# Patient Record
Sex: Male | Born: 1980 | Hispanic: No | Marital: Married
Health system: Southern US, Community
[De-identification: ages and names within clinical notes are randomized; demographics above are authoritative.]

---

## 2019-12-10 ENCOUNTER — Emergency Department: Payer: Self-pay

## 2019-12-10 ENCOUNTER — Emergency Department
Admission: EM | Admit: 2019-12-10 | Discharge: 2019-12-10 | Disposition: A | Discharge status: Home / Self Care | Payer: Self-pay | Attending: Emergency Medicine | Admitting: Emergency Medicine

## 2019-12-10 ENCOUNTER — Other Ambulatory Visit: Payer: Self-pay

## 2019-12-10 DIAGNOSIS — M25561 Pain in right knee: Secondary | ICD-10-CM | POA: Diagnosis not present

## 2019-12-10 DIAGNOSIS — Y9289 Other specified places as the place of occurrence of the external cause: Secondary | ICD-10-CM | POA: Diagnosis not present

## 2019-12-10 DIAGNOSIS — M25562 Pain in left knee: Secondary | ICD-10-CM | POA: Diagnosis not present

## 2019-12-10 DIAGNOSIS — Y990 Civilian activity done for income or pay: Secondary | ICD-10-CM

## 2019-12-10 DIAGNOSIS — S39012A Strain of muscle, fascia and tendon of lower back, initial encounter: Secondary | ICD-10-CM

## 2019-12-10 DIAGNOSIS — Y939 Activity, unspecified: Secondary | ICD-10-CM | POA: Diagnosis not present

## 2019-12-10 DIAGNOSIS — Y999 Unspecified external cause status: Secondary | ICD-10-CM | POA: Diagnosis not present

## 2019-12-10 DIAGNOSIS — X58XXXA Exposure to other specified factors, initial encounter: Secondary | ICD-10-CM | POA: Diagnosis not present

## 2019-12-10 DIAGNOSIS — M25569 Pain in unspecified knee: Secondary | ICD-10-CM | POA: Diagnosis present

## 2019-12-10 NOTE — ED Provider Notes (Signed)
[facility] Emergency Department Provider Note ____________________________________________   First MD Initiated Contact with Patient 12/10/19 1245     (approximate)  I have reviewed the triage vital signs and the nursing notes.  HISTORY  Chief Complaint Back Pain and Knee Pain   HPI Lance Meadows is a 39 y.o. malewho presents to the ED for evaluation of back and knee pain.  Chart review indicates no relevant medical history. Patient denies any relevant medical history.  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  He reports having to emergently lift a patient from the floor onto the stretcher by himself in a peri-code situation.  Patient reports noticing popping sensation to his bilateral knees in his lower thoracic back, and developing severe pain since then.  Reporting 7/10 intensity pain to his bilateral knees and his lower thoracic/upper lumbar back has been constant since the incident, that occurred about 2 hours ago.  Patient been ambulatory since the event, though reports antalgic gait and severe pain.  Denies incontinence, urinary/stool retention.  Denies syncope, headache.  Denies any trauma or falls beyond the lifting mechanism.    No past medical history on file.  There are no problems to display for this patient.   Prior to Admission medications   Not on File    Allergies Patient has no known allergies.  No family history on file.  Social History Social History   Tobacco Use  . Smoking status: Not on file  Substance Use Topics  . Alcohol use: Not on file  . Drug use: Not on file    Review of Systems  Constitutional: No fever/chills Eyes: No visual changes. ENT: No sore throat. Cardiovascular: Denies chest pain. Respiratory: Denies shortness of breath. Gastrointestinal: No abdominal pain.  No nausea, no vomiting.  No diarrhea.  No constipation. Genitourinary: Negative for dysuria. Musculoskeletal: Positive for back and  bilateral knee pain Skin: Negative for rash. Neurological: Negative for headaches, focal weakness or numbness.   ____________________________________________   PHYSICAL EXAM:  VITAL SIGNS: Vitals:   12/10/19 1258  BP: 134/67  Pulse: 68  Resp: 16  Temp: 98.5 F (36.9 C)  SpO2: 99%      Constitutional: Alert and oriented. Well appearing and in no acute distress. Eyes: Conjunctivae are normal. PERRL. EOMI. Head: Atraumatic. Nose: No congestion/rhinnorhea. Mouth/Throat: Mucous membranes are moist.  Oropharynx non-erythematous. Neck: No stridor. No cervical spine tenderness to palpation. Cardiovascular: Normal rate, regular rhythm. Grossly normal heart sounds.  Good peripheral circulation. Respiratory: Normal respiratory effort.  No retractions. Lungs CTAB. Gastrointestinal: Soft , nondistended, nontender to palpation. No abdominal bruits. No CVA tenderness. Musculoskeletal:   No joint effusions. No signs of acute trauma. Diffuse tenderness over the left knee without bony step-offs, laceration or evidence of open injury.  Worsening pain with Thessaly maneuver.  No laxity with Lachman.  Diffuse tenderness of the right knee without bony step-off, laceration or evidence of open injury.  Worsening pain with Thessaly maneuver.  No laxity with Lachman maneuver. No spinal step-offs, but tenderness to palpation directly overlying the lower thoracic spine.  No overlying signs of trauma.  Neurologic:  Normal speech and language. No gross focal neurologic deficits are appreciated. No gait instability noted. Cranial nerves II through XII intact 5/5 strength and sensation in all 4 extremities\ Skin:  Skin is warm, dry and intact. No rash noted. Psychiatric: Mood and affect are normal. Speech and behavior are normal. ____________________________________________  RADIOLOGY  ED MD interpretation:    Official  radiology report(s): MR THORACIC SPINE WO CONTRAST  Result Date:  12/10/2019 CLINICAL DATA:  Mid back injury after lifting a patient off the floor earlier today. EXAM: MRI THORACIC SPINE WITHOUT CONTRAST TECHNIQUE: Multiplanar, multisequence MR imaging of the thoracic spine was performed. No intravenous contrast was administered. COMPARISON:  None. FINDINGS: Alignment:  Physiologic. Vertebrae: No fracture, evidence of discitis, or bone lesion. Cord:  Normal signal and morphology. Paraspinal and other soft tissues: Negative. Disc levels: Tiny central disc protrusions at T6-T7 and T7-T8. No spinal canal or neuroforaminal stenosis at any level. IMPRESSION: 1. No acute abnormality. 2. Tiny central disc protrusions at T6-T7 and T7-T8. No stenosis at any level. Electronically Signed   By: Obie Dredge M.D.   On: 12/10/2019 14:56   MR KNEE RIGHT WO CONTRAST  Result Date: 12/10/2019 CLINICAL DATA:  Knee injury lifting a patient off the floor earlier today. EXAM: MRI OF THE RIGHT KNEE WITHOUT CONTRAST TECHNIQUE: Multiplanar, multisequence MR imaging of the knee was performed. No intravenous contrast was administered. COMPARISON:  None. FINDINGS: MENISCI Medial meniscus:  Intact. Lateral meniscus:  Intact. LIGAMENTS Cruciates:  Intact ACL and PCL. Collaterals: Medial collateral ligament is intact. Lateral collateral ligament complex is intact. CARTILAGE Patellofemoral:  No chondral defect. Medial: Cartilage thinning and superficial irregularity over the central weight-bearing medial femoral condyle. Lateral:  No chondral defect. Joint:  No joint effusion. Normal Hoffa's fat. No plical thickening. Popliteal Fossa: No significant baker cyst. Intact popliteus tendon. Extensor Mechanism: Intact quadriceps tendon and patellar tendon. Intact medial and lateral patellar retinaculum. Intact MPFL. Bones: No focal marrow signal abnormality. No fracture or dislocation. Other: None. IMPRESSION: 1. No evidence of internal derangement. 2. Early medial compartment degenerative changes.  Electronically Signed   By: Obie Dredge M.D.   On: 12/10/2019 14:37   MR KNEE LEFT WO CONTRAST  Result Date: 12/10/2019 CLINICAL DATA:  Knee injury lifting a patient off the floor earlier today. EXAM: MRI OF THE LEFT KNEE WITHOUT CONTRAST TECHNIQUE: Multiplanar, multisequence MR imaging of the knee was performed. No intravenous contrast was administered. COMPARISON:  None. FINDINGS: MENISCI Medial meniscus:  Intact. Lateral meniscus:  Intact. LIGAMENTS Cruciates:  Intact ACL and PCL. Collaterals: Medial collateral ligament is intact. Lateral collateral ligament complex is intact. CARTILAGE Patellofemoral:  No chondral defect. Medial:  No chondral defect. Lateral:  No chondral defect. Joint:  No joint effusion. Normal Hoffa's fat. No plical thickening. Popliteal Fossa: No significant baker cyst. Intact popliteus tendon. Extensor Mechanism: Intact quadriceps tendon and patellar tendon. Intact medial and lateral patellar retinaculum. Intact MPFL. Bones: No focal marrow signal abnormality. No fracture or dislocation. Other: None. IMPRESSION: 1. No evidence of internal derangement. Electronically Signed   By: Obie Dredge M.D.   On: 12/10/2019 14:33   ____________________________________________   MDM / ED COURSE  39 year old male presenting with work-related injury to his bilateral knees and lower thoracic back, without evidence of significant pathology and amenable to outpatient management.  Normal vital signs on room air.  Exam with signs of meniscus tear to his bilateral knees, with worsening pain with Thessaly maneuver.  No laxity with anterior drawer/Lachman maneuvers to suggest ACL pathology.  Exam further with lower thoracic direct tenderness to palpation that reproduces his severe symptoms.  Due to the severity of his symptoms and inability to safely have a prolonged course with patient due to his need for continued provision of ICU level of care, obtain MRIs of these areas of injury.  The  studies do not show  evidence of soft tissue damage or cord compression.  Most significant for T6-T8 disc protrusions, is a likely source of his lower back pain.  Knee is most significant for mild degenerative changes, but no ligamentous or meniscal injury.  Patient's pain is now well controlled.  We will provide appropriate orthopedic follow-up information.  Return precautions for the ED were discussed.  Patient medically stable for discharge home.  ____________________________________________   FINAL CLINICAL IMPRESSION(S) / ED DIAGNOSES  Final diagnoses:  Acute pain of both knees  Strain of lumbar region, initial encounter  Work related injury     ED Discharge Orders    None       Olivene Cookston Katrinka Blazing   Note:  This document was prepared using Conservation officer, historic buildings and may include unintentional dictation errors.   Delton Prairie, MD 12/10/19 [phone]

## 2019-12-10 NOTE — ED Triage Notes (Signed)
Pt had to emergently assist in lifting a covid+ patient off the floor to intubate them, pt injured his back and knee in the process

## 2019-12-10 NOTE — ED Notes (Signed)
Pt assisted a pt from the floor and injured both knees and mid back with bending and lifting the patient

## 2022-01-12 IMAGING — MR MR KNEE*R* W/O CM
7 series · 40 of 40 positions shown · non-contrast
Comparison: None.

CLINICAL DATA: Knee injury lifting a patient off the floor earlier
today.

EXAM:
MRI OF THE RIGHT KNEE WITHOUT CONTRAST
TECHNIQUE: Multiplanar, multisequence MR imaging of the knee was performed. No
intravenous contrast was administered.

[Series 8: T2 fat-sat · axial · right · 4.0mm · 0.50mm/px · z∈[-69,+75]mm · 5 of 30 slices shown (1 of 3)]
[im 1/30]
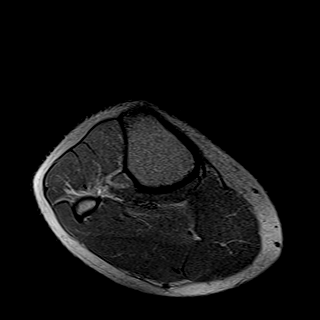
[im 8/30]
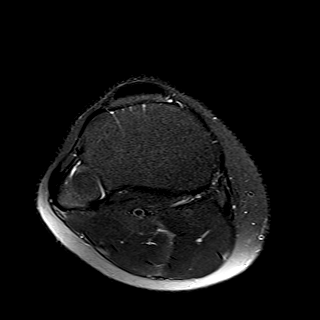
[im 15/30]
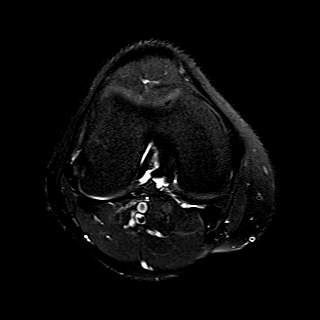
[im 22/30]
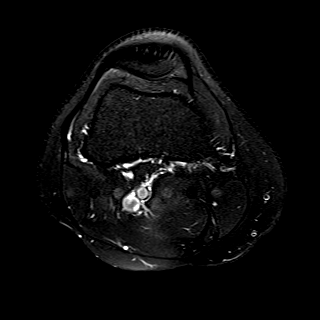
[im 30/30]
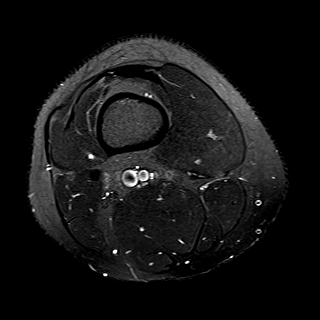

[Series 9: T2 fat-sat · coronal · right · 4.0mm · 0.59mm/px · 6 of 30 slices shown (2 of 3)]
[im 1/30]
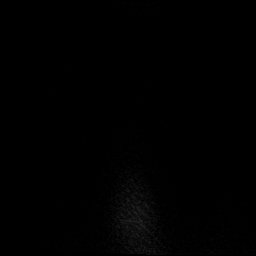
[im 6/30]
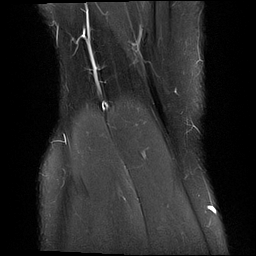
[im 12/30]
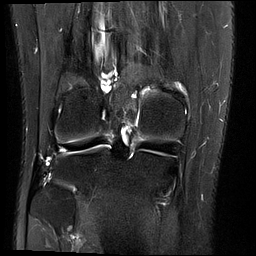
[im 18/30]
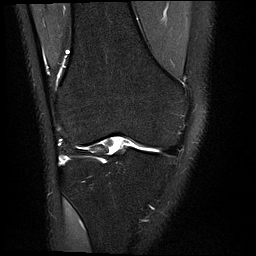
[im 24/30]
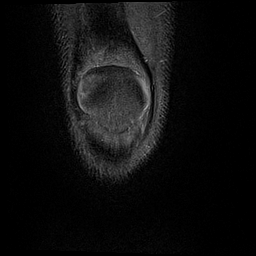
[im 30/30]
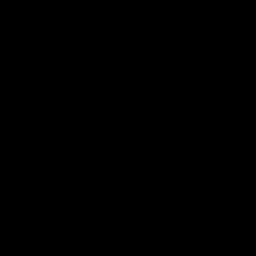

[Series 10: T1 · coronal · right · 4.0mm · 0.59mm/px · 6 of 30 slices shown]
[im 1/30]
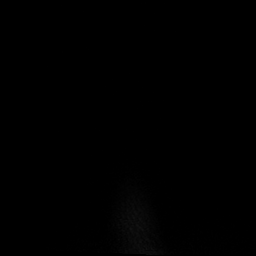
[im 6/30]
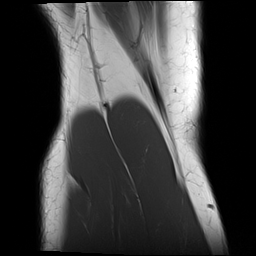
[im 12/30]
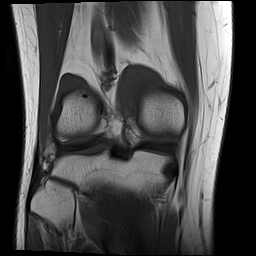
[im 18/30]
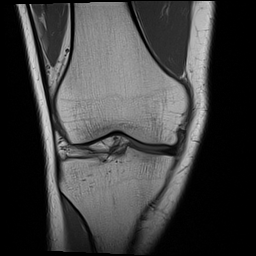
[im 24/30]
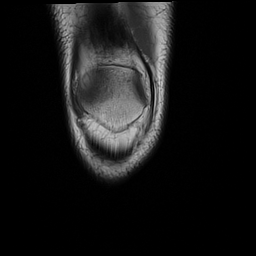
[im 30/30]
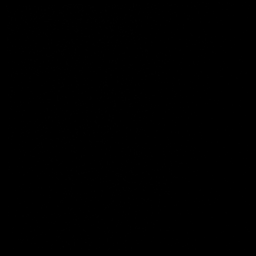

[Series 11: PD fat-sat · coronal · right · 4.0mm · 0.59mm/px · 6 of 30 slices shown (1 of 2)]
[im 1/30]
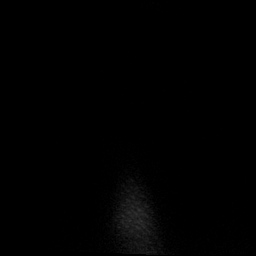
[im 6/30]
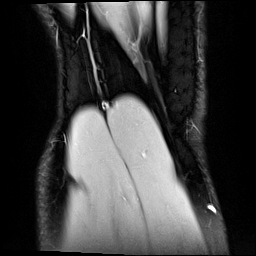
[im 12/30]
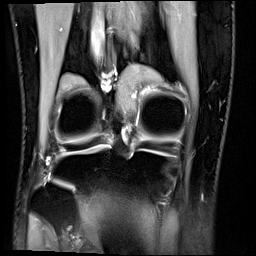
[im 18/30]
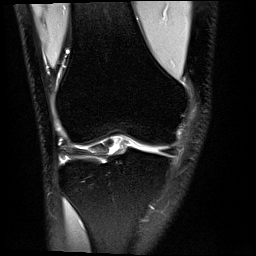
[im 24/30]
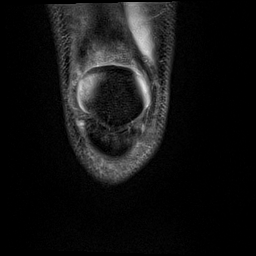
[im 30/30]
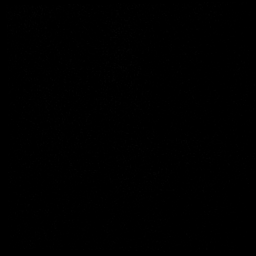

[Series 12: PD fat-sat · sagittal · right · 3.0mm · 0.59mm/px · 7 of 34 slices shown (2 of 2)]
[im 1/34]
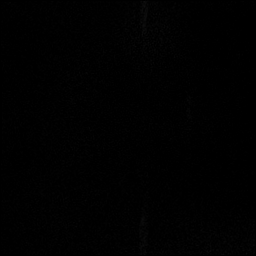
[im 6/34]
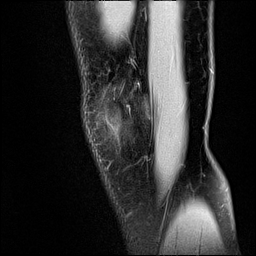
[im 12/34]
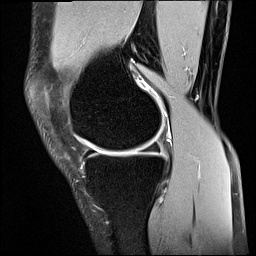
[im 17/34]
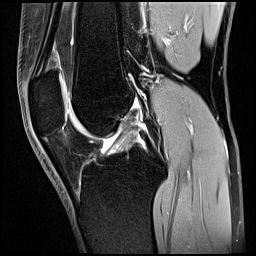
[im 23/34]
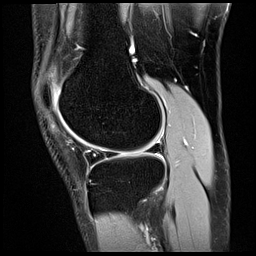
[im 28/34]
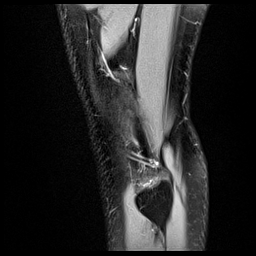
[im 34/34]
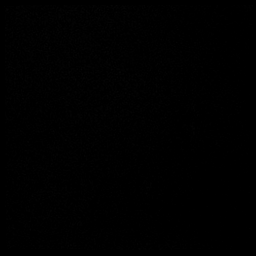

[Series 13: T2 fat-sat · sagittal · right · 3.0mm · 0.59mm/px · 7 of 36 slices shown (3 of 3)]
[im 1/36]
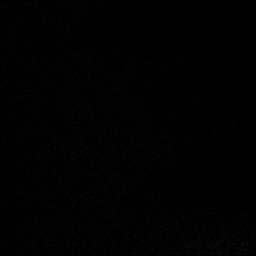
[im 6/36]
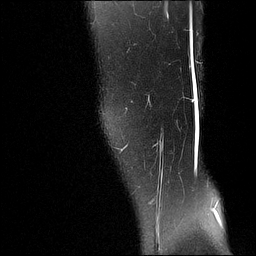
[im 12/36]
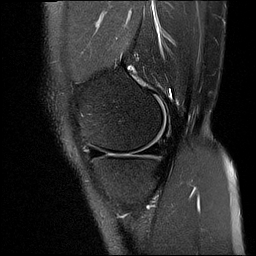
[im 18/36]
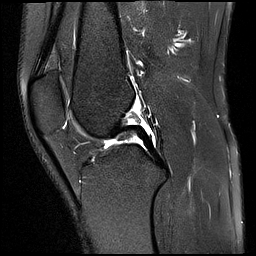
[im 24/36]
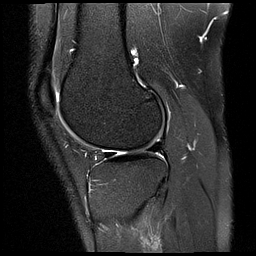
[im 30/36]
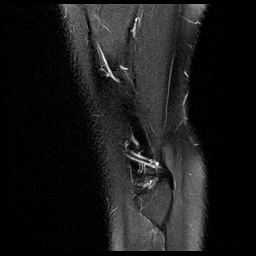
[im 36/36]
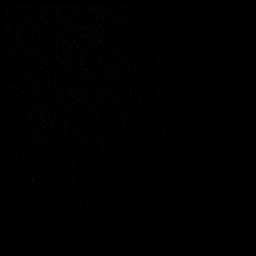

[Series 14: PD · oblique · right · 2.0mm · 0.47mm/px · 3 of 16 slices shown]
[im 1/16]
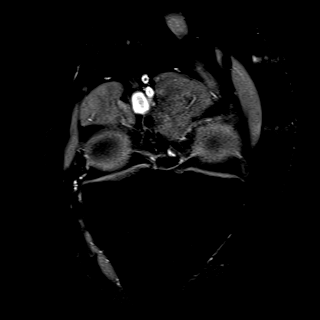
[im 8/16]
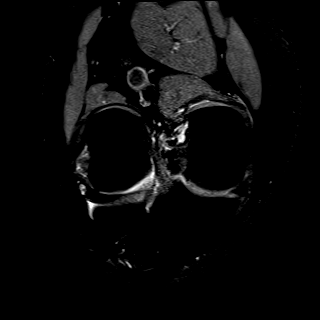
[im 16/16]
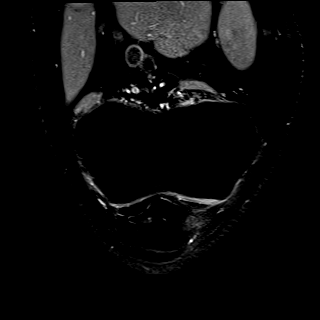

[40 of 40 positions shown; findings below may reference images not displayed]

FINDINGS: MENISCI

Medial meniscus:  Intact.

Lateral meniscus:  Intact.

LIGAMENTS

Cruciates:  Intact ACL and PCL.

Collaterals: Medial collateral ligament is intact. Lateral
collateral ligament complex is intact.

CARTILAGE

Patellofemoral:  No chondral defect.

Medial: Cartilage thinning and superficial irregularity over the
central weight-bearing medial femoral condyle.

Lateral:  No chondral defect.

Joint:  No joint effusion. Normal Hoffa's fat. No plical thickening.

Popliteal Fossa: No significant baker cyst. Intact popliteus tendon.

Extensor Mechanism: Intact quadriceps tendon and patellar tendon.
Intact medial and lateral patellar retinaculum. Intact MPFL.

Bones: No focal marrow signal abnormality. No fracture or
dislocation.

Other: None.
IMPRESSION: 1. No evidence of internal derangement.
2. Early medial compartment degenerative changes.

## 2022-01-12 IMAGING — MR MR THORACIC SPINE W/O CM
6 series · 29 of 48 positions shown · non-contrast
Comparison: None.

CLINICAL DATA: Mid back injury after lifting a patient off the
floor earlier today.

EXAM:
MRI THORACIC SPINE WITHOUT CONTRAST
TECHNIQUE: Multiplanar, multisequence MR imaging of the thoracic spine was
performed. No intravenous contrast was administered.

[Series 18: T1 · sagittal · 6.0mm · 1.41mm/px · 2 of 9 slices shown (1 of 2)]
[im 1/9]
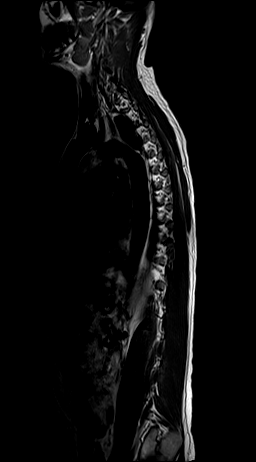
[im 9/9]
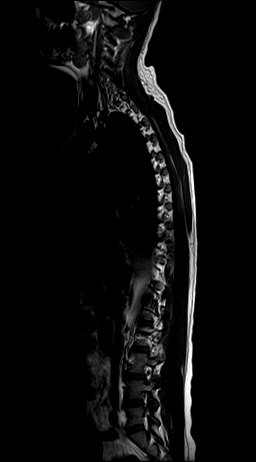

[Series 19: T2 · sagittal · 3.0mm · 1.06mm/px · 6 of 17 slices shown (1 of 2)]
[im 1/17]
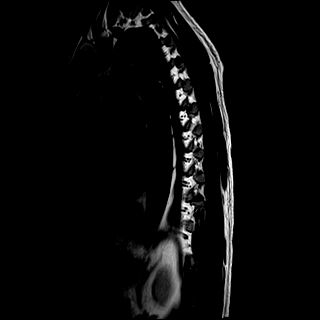
[im 4/17]
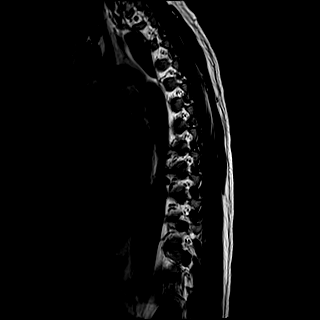
[im 7/17]
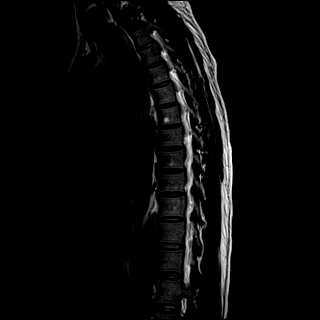
[im 10/17]
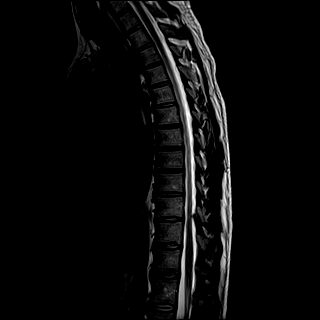
[im 13/17]
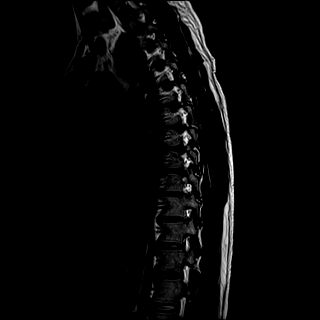
[im 17/17]
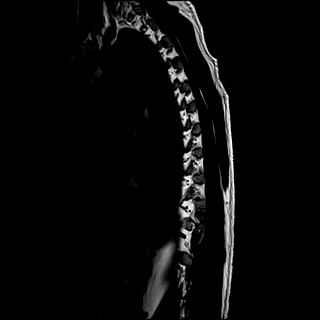

[Series 20: T1 · sagittal · 3.0mm · 1.06mm/px · 6 of 17 slices shown (2 of 2)]
[im 1/17]
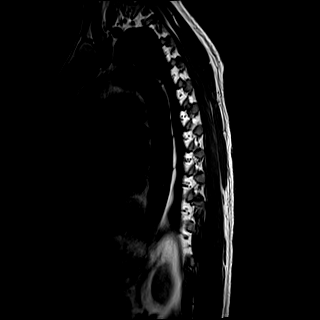
[im 4/17]
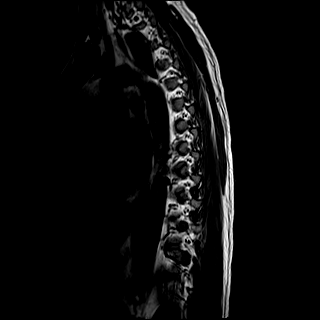
[im 7/17]
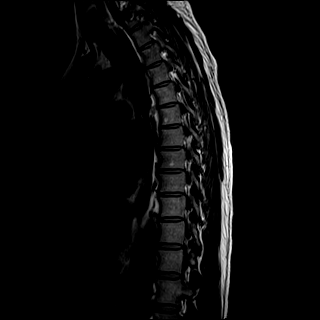
[im 10/17]
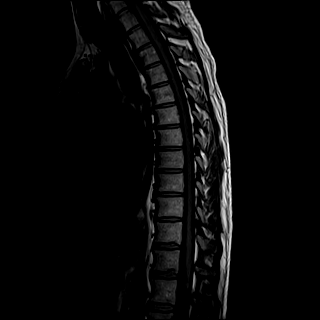
[im 13/17]
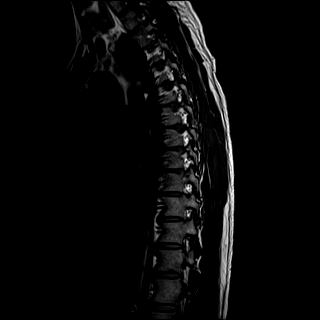
[im 17/17]
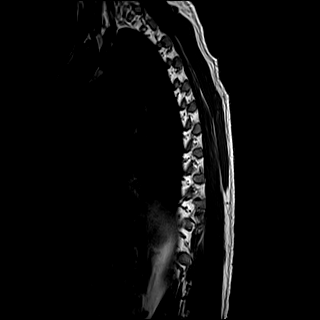

[Series 21: STIR · sagittal · 3.0mm · 0.53mm/px · 6 of 17 slices shown]
[im 1/17]
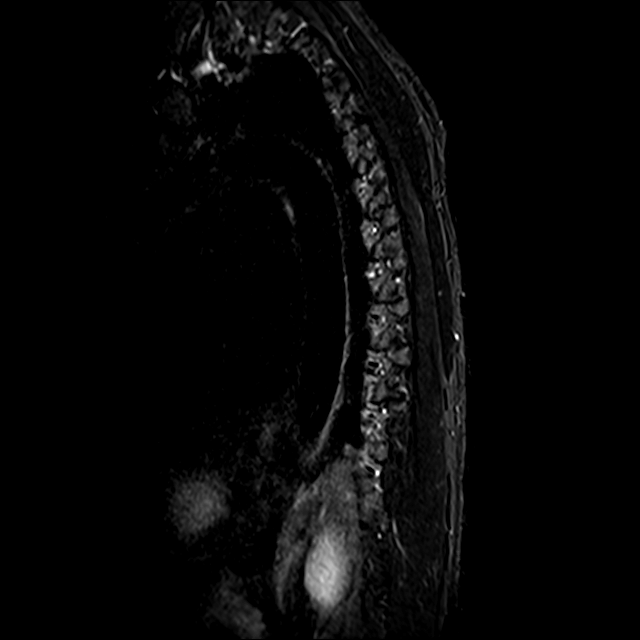
[im 4/17]
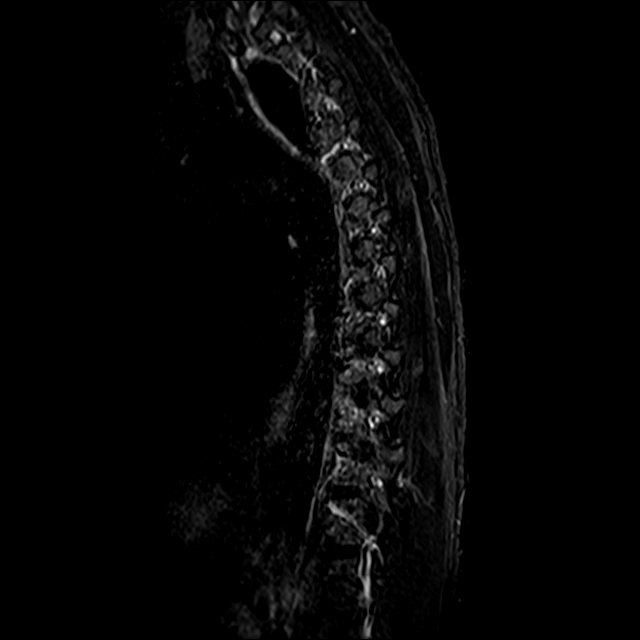
[im 7/17]
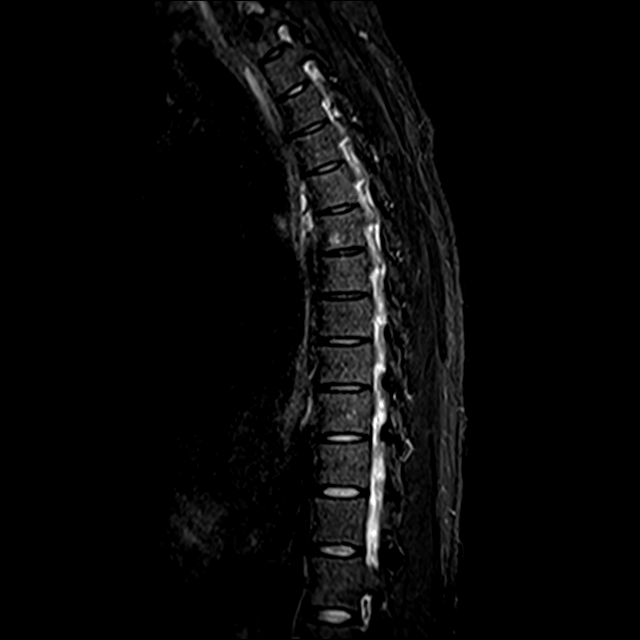
[im 10/17]
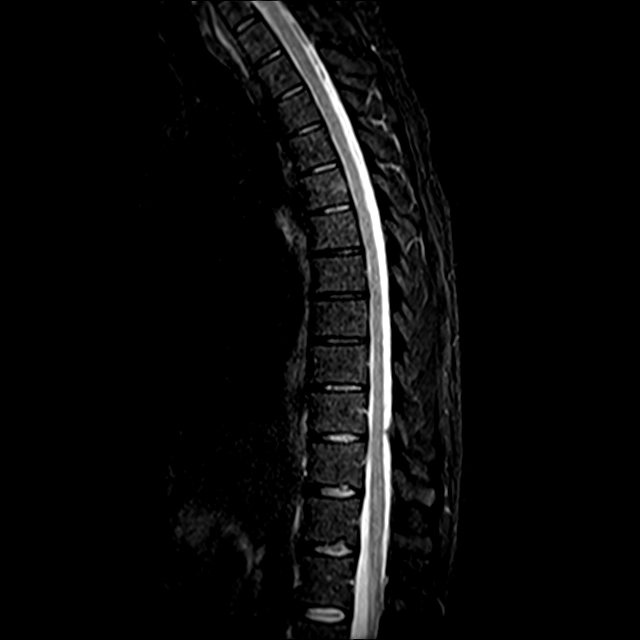
[im 13/17]
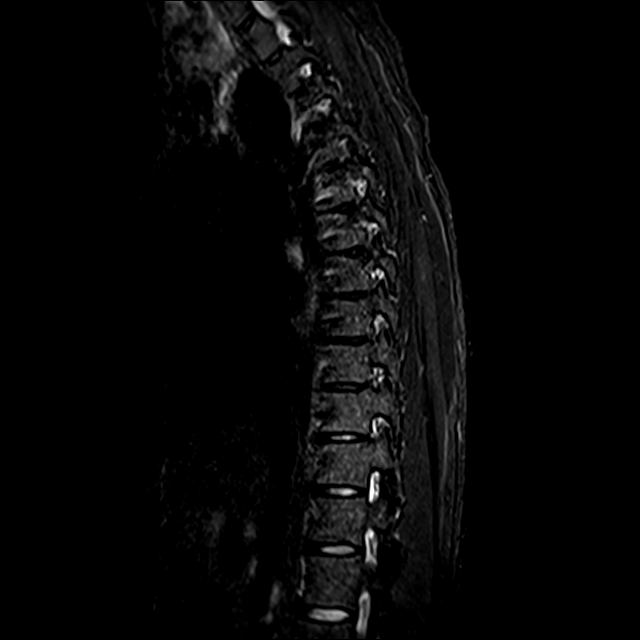
[im 17/17]
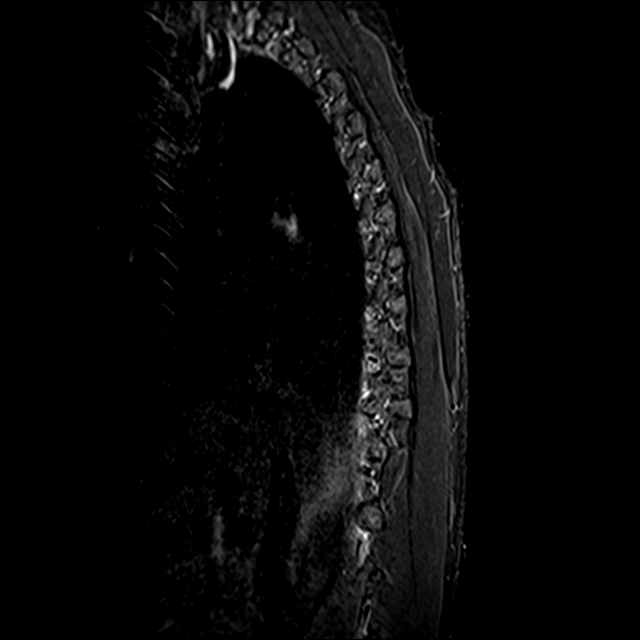

[Series 22: T2 · axial · 4.0mm · 0.59mm/px · z∈[-311,-74]mm · 8 of 39 slices shown (2 of 2)]
[im 1/39]
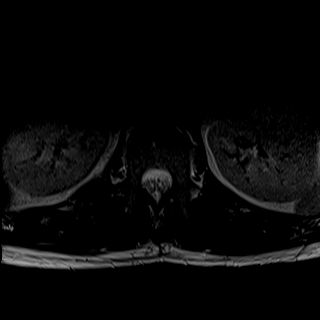
[im 6/39]
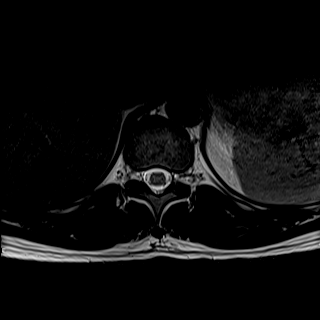
[im 12/39]
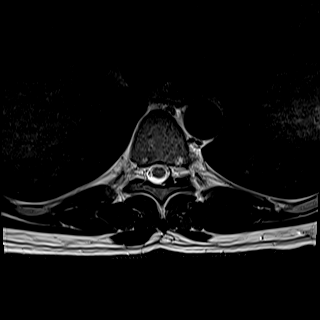
[im 18/39]
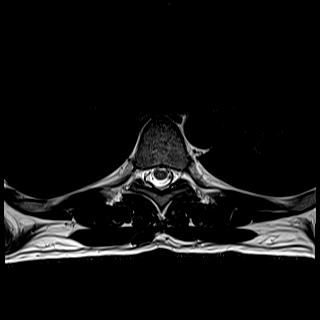
[im 21/39]
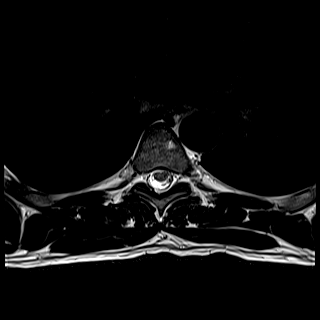
[im 27/39]
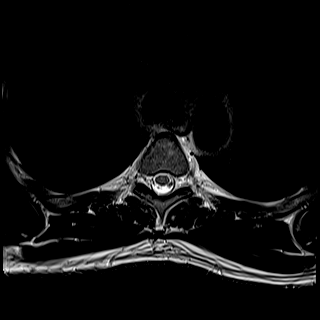
[im 33/39]
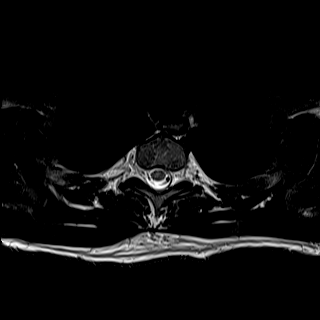
[im 39/39]
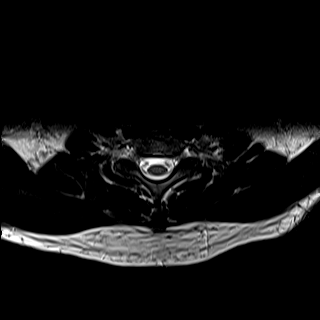

[Series 23: GRE · axial · 4.0mm · 0.37mm/px · 1 of 39 slices shown]
[im 1/39]
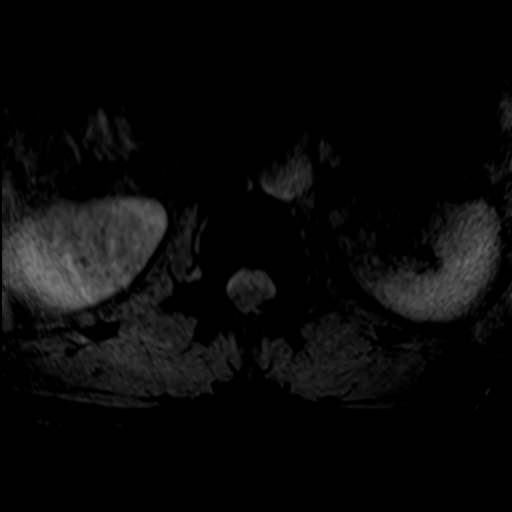

[29 of 48 positions shown; findings below may reference images not displayed]

FINDINGS: Alignment:  Physiologic.

Vertebrae: No fracture, evidence of discitis, or bone lesion.

Cord:  Normal signal and morphology.

Paraspinal and other soft tissues: Negative.

Disc levels:

Tiny central disc protrusions at T6-T7 and T7-T8. No spinal canal or
neuroforaminal stenosis at any level.
IMPRESSION: 1. No acute abnormality.
2. Tiny central disc protrusions at T6-T7 and T7-T8. No stenosis at
any level.

## 2022-01-13 ENCOUNTER — Ambulatory Visit: Payer: 59 | Admitting: Anesthesiology

## 2022-01-13 ENCOUNTER — Encounter
Admission: RE | Disposition: A | Discharge status: Home / Self Care | Payer: Self-pay | Source: Home / Self Care | Attending: Gastroenterology

## 2022-01-13 ENCOUNTER — Ambulatory Visit
Admission: RE | Admit: 2022-01-13 | Discharge: 2022-01-13 | Disposition: A | Discharge status: Home / Self Care | Payer: 59 | Attending: Gastroenterology | Admitting: Gastroenterology

## 2022-01-13 DIAGNOSIS — Z9889 Other specified postprocedural states: Secondary | ICD-10-CM | POA: Diagnosis not present

## 2022-01-13 DIAGNOSIS — E039 Hypothyroidism, unspecified: Secondary | ICD-10-CM | POA: Diagnosis not present

## 2022-01-13 DIAGNOSIS — K449 Diaphragmatic hernia without obstruction or gangrene: Secondary | ICD-10-CM | POA: Diagnosis not present

## 2022-01-13 DIAGNOSIS — R1013 Epigastric pain: Secondary | ICD-10-CM | POA: Insufficient documentation

## 2022-01-13 HISTORY — PX: ESOPHAGOGASTRODUODENOSCOPY (EGD) WITH PROPOFOL: SHX5813

## 2022-01-13 SURGERY — ESOPHAGOGASTRODUODENOSCOPY (EGD) WITH PROPOFOL
Anesthesia: General

## 2022-01-13 MED ORDER — PROPOFOL 10 MG/ML IV BOLUS
INTRAVENOUS | Status: AC
  Filled 2022-01-13: qty 40

## 2022-01-13 MED ORDER — PROPOFOL 500 MG/50ML IV EMUL
INTRAVENOUS | Status: DC | PRN
  Administered 2022-01-13: 185 ug/kg/min via INTRAVENOUS
  Administered 2022-01-13: 70 mg via INTRAVENOUS

## 2022-01-13 MED ORDER — SODIUM CHLORIDE 0.9 % IV SOLN
INTRAVENOUS | Status: DC
  Administered 2022-01-13: 20 mL/h via INTRAVENOUS

## 2022-01-13 MED ORDER — PROPOFOL 10 MG/ML IV BOLUS
INTRAVENOUS | Status: AC
  Filled 2022-01-13: qty 20

## 2022-01-13 NOTE — Transfer of Care (Signed)
Immediate Anesthesia Transfer of Care Note  Patient: Lance Meadows  Procedure(s) Performed: ESOPHAGOGASTRODUODENOSCOPY (EGD) WITH PROPOFOL  Patient Location: PACU  Anesthesia Type:General  Level of Consciousness: drowsy  Airway & Oxygen Therapy: Patient Spontanous Breathing and Patient connected to face mask oxygen  Post-op Assessment: Report given to RN and Post -op Vital signs reviewed and stable  Post vital signs: Reviewed and stable  Last Vitals:  Vitals Value Taken Time  BP 98/52 01/13/22 0809  Temp 36.4 C 01/13/22 0808  Pulse 66 01/13/22 0809  Resp 15 01/13/22 0809  SpO2 100 % 01/13/22 0809  Vitals shown include unvalidated device data.  Last Pain:  Vitals:   01/13/22 0808  TempSrc: Temporal  PainSc: Asleep         Complications: No notable events documented.

## 2022-01-13 NOTE — H&P (Signed)
Outpatient short stay form Pre-procedure 01/13/2022  Lesly Rubenstein, MD  Primary Physician: [facility]  Reason for visit:  Dyspepsia  History of present illness:    41 y/o gentleman with history of nissen fundoplication who has been having worsening dyspeptic symptoms here for EGD. His surgery was about 5 years ago. No blood thinners.    Current Facility-Administered Medications:    0.9 %  sodium chloride infusion, , Intravenous, Continuous, Rosebud Koenen, Hilton Cork, MD, Last Rate: 20 mL/hr at 01/13/22 0710, 20 mL/hr at 01/13/22 0710  No medications prior to admission.     No Known Allergies   No past medical history on file.  Review of systems:  Otherwise negative.    Physical Exam  Gen: Alert, oriented. Appears stated age.  HEENT: PERRLA. Lungs: No respiratory distress CV: RRR Abd: soft, benign, no masses Ext: No edema    Planned procedures: Proceed with EGD. The patient understands the nature of the planned procedure, indications, risks, alternatives and potential complications including but not limited to bleeding, infection, perforation, damage to internal organs and possible oversedation/side effects from anesthesia. The patient agrees and gives consent to proceed.  Please refer to procedure notes for findings, recommendations and patient disposition/instructions.     Lesly Rubenstein, MD [facility]

## 2022-01-13 NOTE — Op Note (Signed)
[facility] Patient Name: Lance Meadows Procedure Date: 01/13/2022 7:50 AM MRN: [id number] Account #: [id number] Date of Birth: Apr 07, 1981 Admit Type: Outpatient Age: 41 Room: [facility] ENDO ROOM 3 Gender: Male Note Status: Finalized Instrument Name: Upper Endoscope [id number] Procedure:             Upper GI endoscopy Indications:           Dyspepsia Providers:             Andrey Farmer MD, MD Referring MD:          Adrian Prows (Referring MD) Medicines:             Monitored Anesthesia Care Complications:         No immediate complications. Estimated blood loss:                         Minimal. Procedure:             Pre-Anesthesia Assessment:                        - Prior to the procedure, a History and Physical was                         performed, and patient medications and allergies were                         reviewed. The patient is competent. The risks and                         benefits of the procedure and the sedation options and                         risks were discussed with the patient. All questions                         were answered and informed consent was obtained.                         Patient identification and proposed procedure were                         verified by the physician, the nurse, the                         anesthesiologist, the anesthetist and the technician                         in the endoscopy suite. Mental Status Examination:                         alert and oriented. Airway Examination: normal                         oropharyngeal airway and neck mobility. Respiratory                         Examination: clear to auscultation. CV Examination:  normal. Prophylactic Antibiotics: The patient does not                         require prophylactic antibiotics. Prior                         Anticoagulants: The patient has taken no previous                          anticoagulant or antiplatelet agents. ASA Grade                         Assessment: I - A normal, healthy patient. After                         reviewing the risks and benefits, the patient was                         deemed in satisfactory condition to undergo the                         procedure. The anesthesia plan was to use monitored                         anesthesia care (MAC). Immediately prior to                         administration of medications, the patient was                         re-assessed for adequacy to receive sedatives. The                         heart rate, respiratory rate, oxygen saturations,                         blood pressure, adequacy of pulmonary ventilation, and                         response to care were monitored throughout the                         procedure. The physical status of the patient was                         re-assessed after the procedure.                        After obtaining informed consent, the endoscope was                         passed under direct vision. Throughout the procedure,                         the patient's blood pressure, pulse, and oxygen                         saturations were monitored continuously. The Endoscope  was introduced through the mouth, and advanced to the                         second part of duodenum. The upper GI endoscopy was                         accomplished without difficulty. The patient tolerated                         the procedure well. Findings:      A 1 cm hiatal hernia was present. This was sliding.      The exam of the esophagus was otherwise normal.      Evidence of a prior Nissen fundoplication was found in the cardia. This       was characterized by healthy appearing mucosa and an intact appearance.      Biopsies were taken with a cold forceps in the stomach for Helicobacter       pylori testing. Estimated blood loss was minimal.      The examined  duodenum was normal. Impression:            - 1 cm sliding hiatal hernia.                        - Normal stomach. Biopsied.                        - Normal examined duodenum. Recommendation:        - Discharge patient to home.                        - Resume previous diet.                        - Continue present medications.                        - Await pathology results.                        - Return to referring physician as previously                         scheduled. Procedure Code(s):     --- Professional ---                        [phone], Esophagogastroduodenoscopy, flexible,                         transoral; with biopsy, single or multiple Diagnosis Code(s):     --- Professional ---                        K44.9, Diaphragmatic hernia without obstruction or                         gangrene                        R10.13, Epigastric pain CPT copyright 2019 American Medical Association. All rights reserved. The codes documented in this report are preliminary and upon coder review may  be revised to meet current compliance requirements. Andrey Farmer MD, MD 01/13/2022 8:13:00 AM Number of Addenda: 0 Note Initiated On: 01/13/2022 7:50 AM Estimated Blood Loss:  Estimated blood loss was minimal.      [facility]

## 2022-01-13 NOTE — Interval H&P Note (Signed)
History and Physical Interval Note:  01/13/2022 7:52 AM  Lance Meadows  has presented today for surgery, with the diagnosis of dyspepsia.  The various methods of treatment have been discussed with the patient and family. After consideration of risks, benefits and other options for treatment, the patient has consented to  Procedure(s): ESOPHAGOGASTRODUODENOSCOPY (EGD) WITH PROPOFOL (N/A) as a surgical intervention.  The patient's history has been reviewed, patient examined, no change in status, stable for surgery.  I have reviewed the patient's chart and labs.  Questions were answered to the patient's satisfaction.     Lesly Rubenstein  Ok to proceed with EGD

## 2022-01-14 ENCOUNTER — Encounter: Payer: Self-pay | Admitting: Gastroenterology

## 2022-01-14 LAB — SURGICAL PATHOLOGY

## 2022-01-17 NOTE — Anesthesia Preprocedure Evaluation (Addendum)
Anesthesia Evaluation  Patient identified by MRN, date of birth, ID band Patient awake    Reviewed: Allergy & Precautions, NPO status , Patient's Chart, lab work & pertinent test results  Airway Mallampati: II  TM Distance: >3 FB Neck ROM: Full    Dental  (+) Teeth Intact   Pulmonary neg pulmonary ROS   Pulmonary exam normal        Cardiovascular Normal cardiovascular exam Rhythm:Regular     Neuro/Psych negative neurological ROS  negative psych ROS   GI/Hepatic negative GI ROS, Neg liver ROS,,,  Endo/Other  negative endocrine ROSHypothyroidism    Renal/GU negative Renal ROS     Musculoskeletal negative musculoskeletal ROS (+)    Abdominal Normal abdominal exam  (+)   Peds  Hematology negative hematology ROS (+)   Anesthesia Other Findings   Reproductive/Obstetrics negative OB ROS                             Anesthesia Physical Anesthesia Plan  ASA: 2  Anesthesia Plan: General   Post-op Pain Management:    Induction: Intravenous  PONV Risk Score and Plan:   Airway Management Planned: Natural Airway  Additional Equipment:   Intra-op Plan:   Post-operative Plan:   Informed Consent: I have reviewed the patients History and Physical, chart, labs and discussed the procedure including the risks, benefits and alternatives for the proposed anesthesia with the patient or authorized representative who has indicated his/her understanding and acceptance.       Plan Discussed with: CRNA and Surgeon  Anesthesia Plan Comments:        Anesthesia Quick Evaluation

## 2022-02-27 NOTE — Anesthesia Postprocedure Evaluation (Signed)
Anesthesia Post Note  Patient: Lance Meadows  Procedure(s) Performed: ESOPHAGOGASTRODUODENOSCOPY (EGD) WITH PROPOFOL  Patient location during evaluation: PACU Anesthesia Type: General Level of consciousness: awake Pain management: pain level controlled Respiratory status: spontaneous breathing and nonlabored ventilation Cardiovascular status: stable Anesthetic complications: no  No notable events documented.   Last Vitals:  Vitals:   01/13/22 0818 01/13/22 0828  BP: (!) 105/48 113/66  Pulse: (!) 59 (!) 59  Resp: 14 20  Temp:    SpO2: 99% 99%    Last Pain:  Vitals:   01/13/22 0828  TempSrc:   PainSc: 0-No pain                 VAN STAVEREN,Tyger Wichman

## 2023-04-22 ENCOUNTER — Other Ambulatory Visit: Payer: Self-pay | Admitting: Infectious Diseases

## 2023-04-22 DIAGNOSIS — R0781 Pleurodynia: Secondary | ICD-10-CM

## 2023-04-22 DIAGNOSIS — R079 Chest pain, unspecified: Secondary | ICD-10-CM

## 2023-04-24 ENCOUNTER — Ambulatory Visit
Admission: RE | Admit: 2023-04-24 | Discharge: 2023-04-24 | Disposition: A | Discharge status: Home / Self Care | Payer: 59 | Source: Ambulatory Visit | Attending: Infectious Diseases | Admitting: Infectious Diseases

## 2023-04-24 DIAGNOSIS — R0781 Pleurodynia: Secondary | ICD-10-CM | POA: Diagnosis present

## 2023-04-24 DIAGNOSIS — R079 Chest pain, unspecified: Secondary | ICD-10-CM | POA: Diagnosis present

## 2023-04-24 MED ORDER — IOHEXOL 300 MG/ML  SOLN
75.0000 mL | Freq: Once | INTRAMUSCULAR | Status: AC | PRN
  Administered 2023-04-24: 75 mL via INTRAVENOUS
